# Patient Record
Sex: Female | Born: 1935 | Race: White | Hispanic: No | Marital: Married
Health system: Southern US, Community
[De-identification: ages and names within clinical notes are randomized; demographics above are authoritative.]

---

## 1999-03-21 ENCOUNTER — Other Ambulatory Visit: Admission: RE | Admit: 1999-03-21 | Discharge: 1999-03-21 | Payer: Self-pay | Admitting: Internal Medicine

## 1999-04-18 ENCOUNTER — Other Ambulatory Visit: Admission: RE | Admit: 1999-04-18 | Discharge: 1999-04-18 | Payer: Self-pay | Admitting: Gynecology

## 1999-04-29 ENCOUNTER — Emergency Department (HOSPITAL_COMMUNITY): Admission: EM | Admit: 1999-04-29 | Discharge: 1999-04-29 | Payer: Self-pay | Admitting: Emergency Medicine

## 2000-04-02 ENCOUNTER — Other Ambulatory Visit: Admission: RE | Admit: 2000-04-02 | Discharge: 2000-04-02 | Payer: Self-pay | Admitting: Internal Medicine

## 2000-07-01 ENCOUNTER — Encounter: Payer: Self-pay | Admitting: Internal Medicine

## 2000-07-01 ENCOUNTER — Encounter: Admission: RE | Admit: 2000-07-01 | Discharge: 2000-07-01 | Payer: Self-pay | Admitting: Internal Medicine

## 2000-09-30 ENCOUNTER — Other Ambulatory Visit: Admission: RE | Admit: 2000-09-30 | Discharge: 2000-09-30 | Payer: Self-pay | Admitting: Internal Medicine

## 2001-04-14 ENCOUNTER — Other Ambulatory Visit: Admission: RE | Admit: 2001-04-14 | Discharge: 2001-04-14 | Payer: Self-pay | Admitting: Internal Medicine

## 2003-05-24 ENCOUNTER — Other Ambulatory Visit: Admission: RE | Admit: 2003-05-24 | Discharge: 2003-05-24 | Payer: Self-pay | Admitting: Internal Medicine

## 2003-12-08 ENCOUNTER — Encounter: Admission: RE | Admit: 2003-12-08 | Discharge: 2003-12-08 | Payer: Self-pay | Admitting: Internal Medicine

## 2004-06-13 ENCOUNTER — Encounter: Admission: RE | Admit: 2004-06-13 | Discharge: 2004-06-13 | Payer: Self-pay | Admitting: Internal Medicine

## 2005-06-27 ENCOUNTER — Encounter: Admission: RE | Admit: 2005-06-27 | Discharge: 2005-06-27 | Payer: Self-pay | Admitting: Family Medicine

## 2005-07-04 ENCOUNTER — Encounter: Admission: RE | Admit: 2005-07-04 | Discharge: 2005-07-04 | Payer: Self-pay | Admitting: Family Medicine

## 2005-07-28 ENCOUNTER — Emergency Department (HOSPITAL_COMMUNITY): Admission: EM | Admit: 2005-07-28 | Discharge: 2005-07-28 | Payer: Self-pay | Admitting: Emergency Medicine

## 2005-08-03 ENCOUNTER — Encounter: Admission: RE | Admit: 2005-08-03 | Discharge: 2005-08-03 | Payer: Self-pay | Admitting: Family Medicine

## 2005-12-30 IMAGING — CR DG CHEST 2V
2 series · 2 of 2 positions shown · non-contrast
Comparison: No prior exams for comparison.

CLINICAL DATA: 77-year-old with confusion, inappropriate behavior.  Unsteady gait.  
 CHEST ? 2 VIEW:

[view not recorded (1 of 2)]
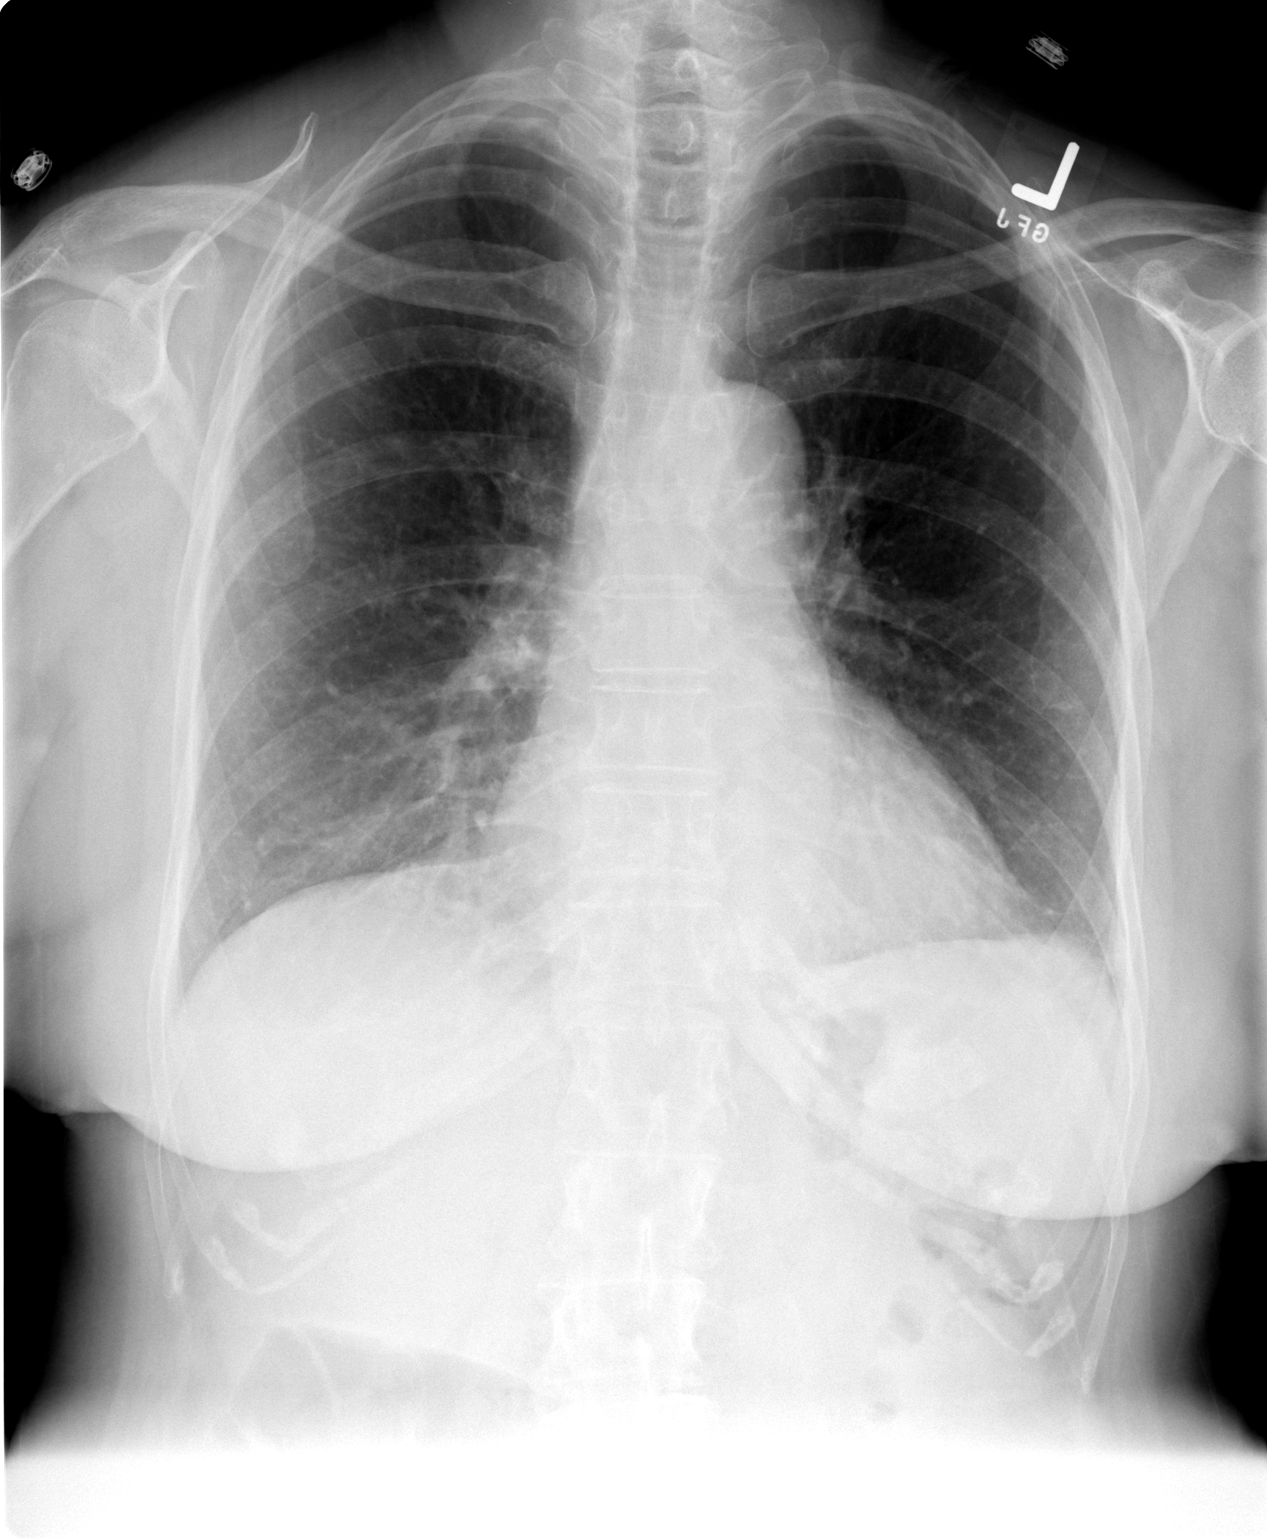

[view not recorded (2 of 2)]
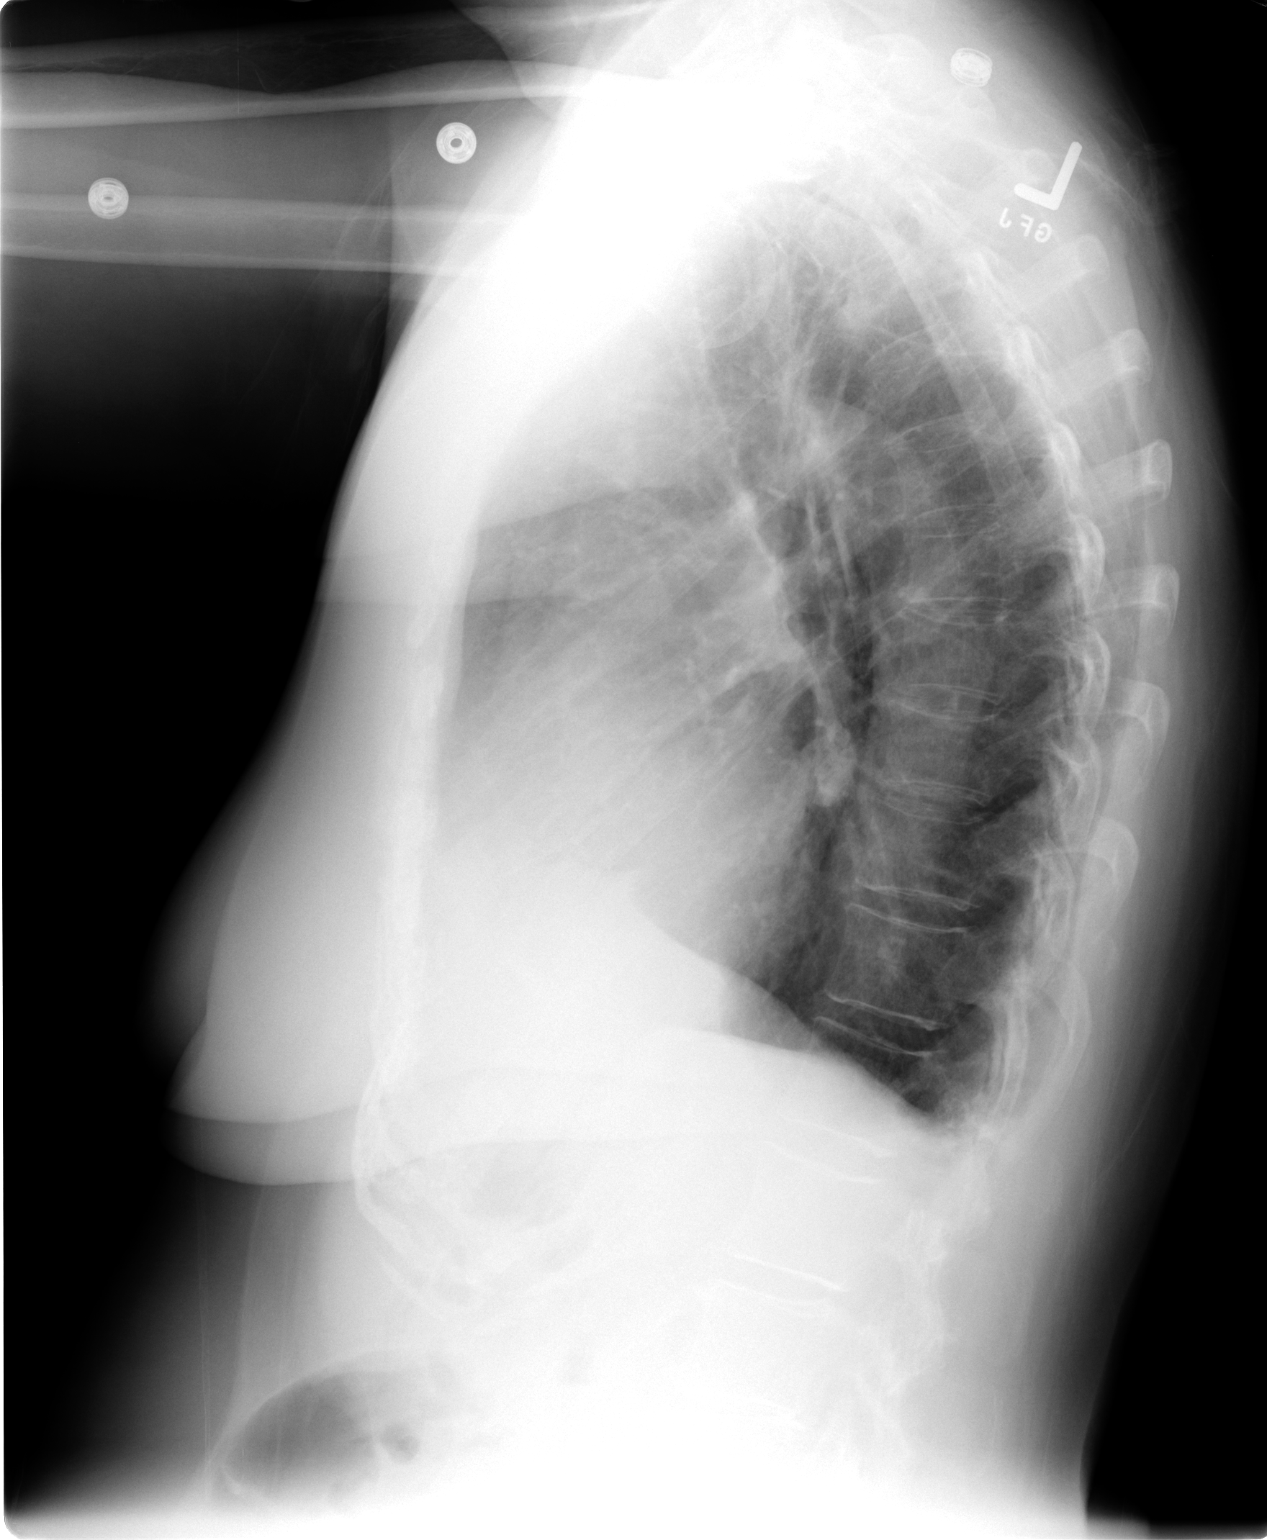

[2 of 2 positions shown; findings below may reference images not displayed]

The heart size and mediastinal contours are within normal limits.  Both lungs are clear.  The visualized skeletal structures are unremarkable.
IMPRESSION: No active cardiopulmonary disease.

## 2006-01-05 IMAGING — CR DG UGI W/ HIGH DENSITY W/KUB
19 of 24 series · 19 of 24 positions shown · non-contrast
Comparison: none

CLINICAL DATA: 77 year old with persistent hiccups, early satiety. 
DOUBLE CONTRAST UPPER GI W/KUB:
Initial barium swallows demonstrate esophageal dysmotility.  There were frequent tertiary contractions and probably some component of diffuse esophageal spasm with esophageal stasis.  No ulcerations.  There is also a widely patent lower esophageal mucosal ring and also a muscular ring.  13mm barium pill hung up just above the muscular ring but it eventually did pass through.  Patient has a moderate sized sliding type hiatal hernia and had several episodes of spontaneous gastroesophageal reflux.   Stomach, duodenal bulb, and C-loop are normal in appearance.

[run (1 of 19)]
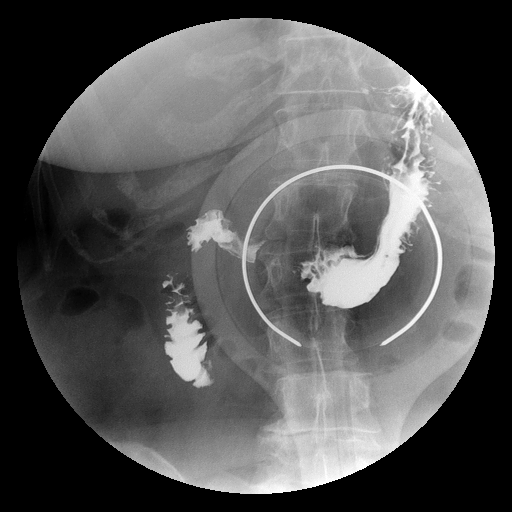

[run (2 of 19)]
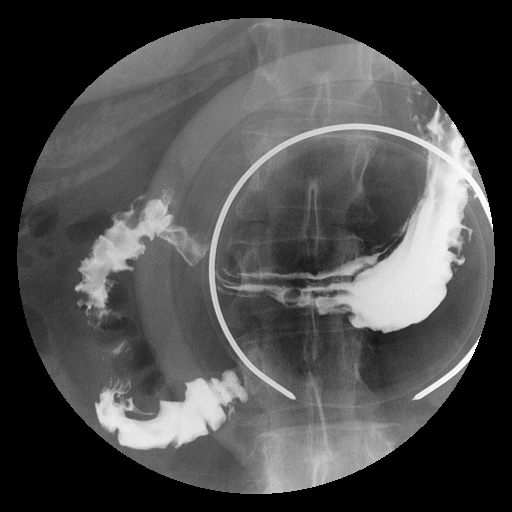

[run (3 of 19)]
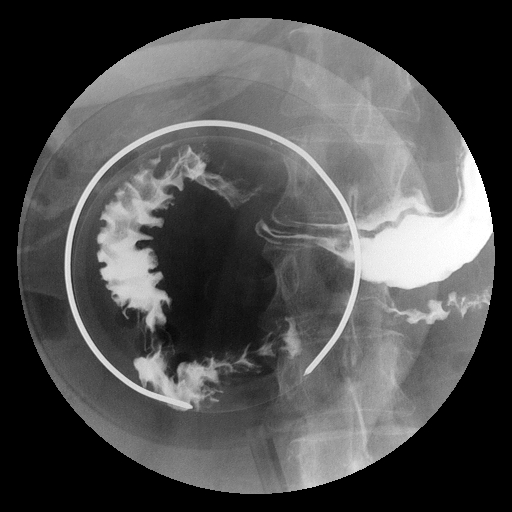

[run (4 of 19)]
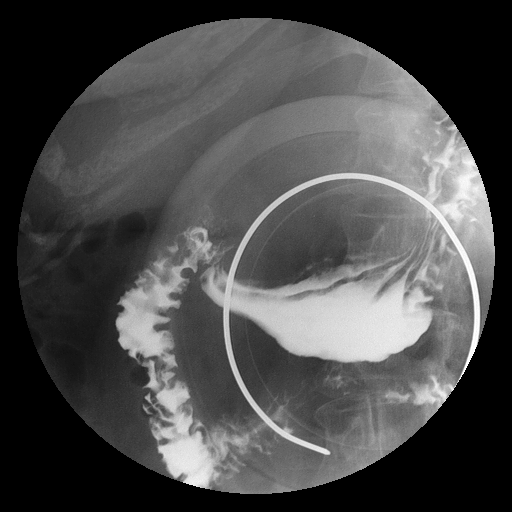

[run (5 of 19)]
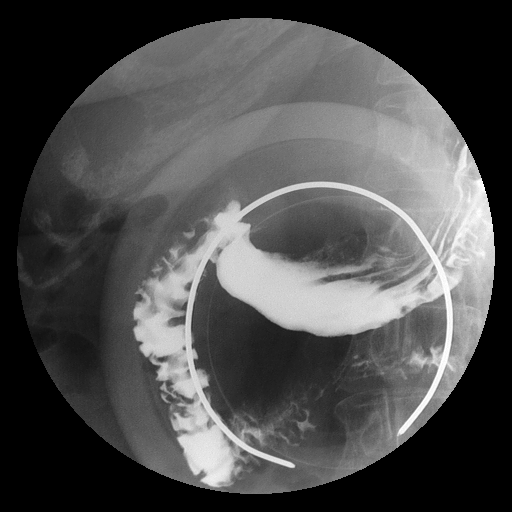

[run (6 of 19)]
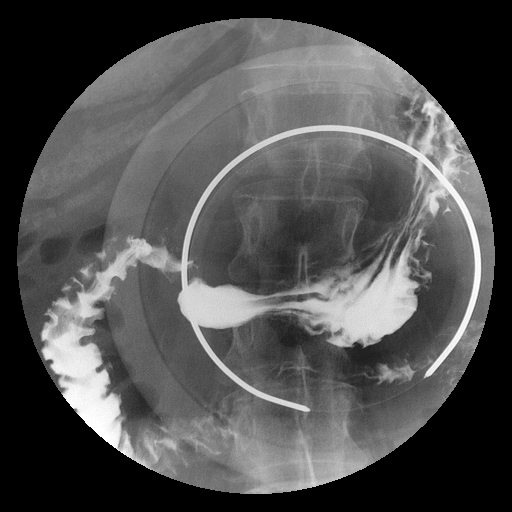

[run (7 of 19)]
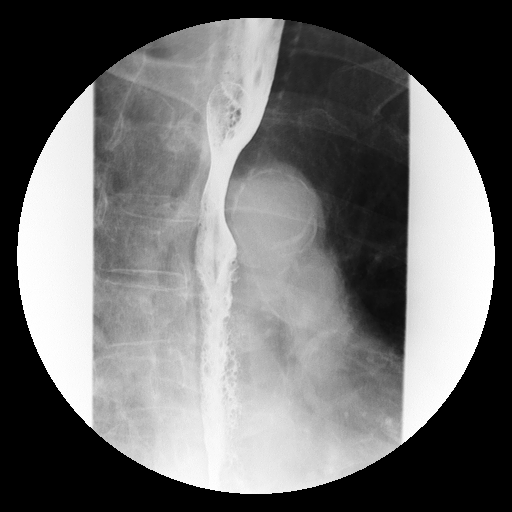

[run (8 of 19)]
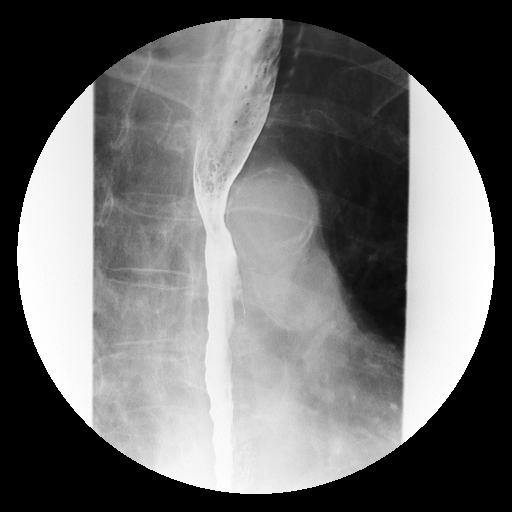

[run (9 of 19)]
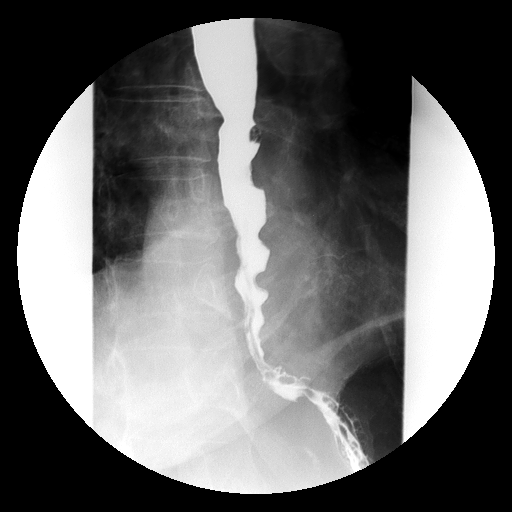

[run (10 of 19)]
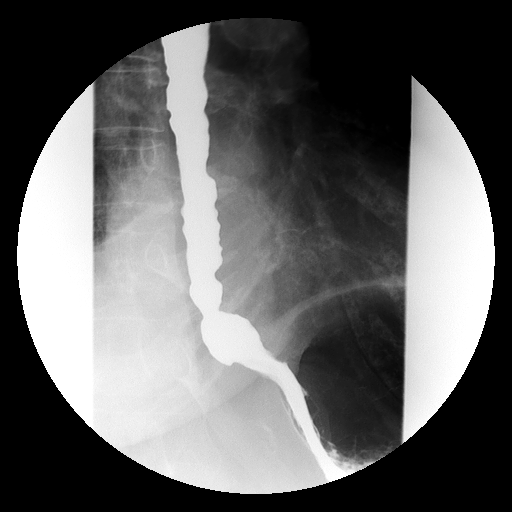

[run (11 of 19)]
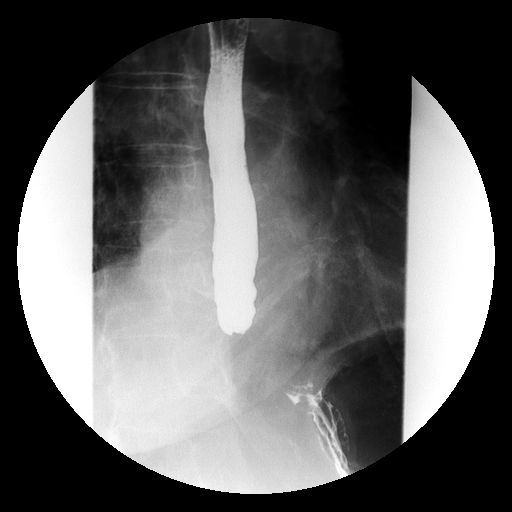

[run (12 of 19)]
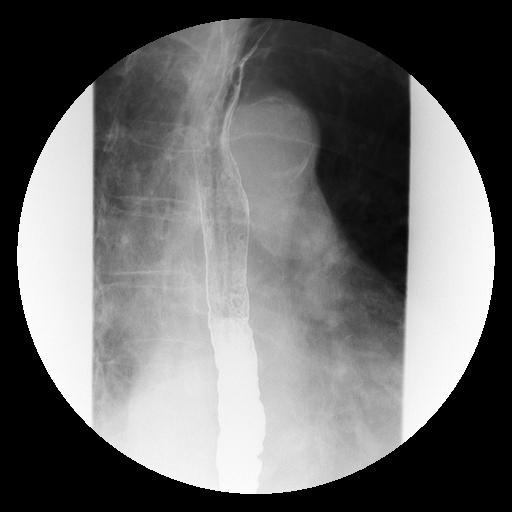

[run (13 of 19)]
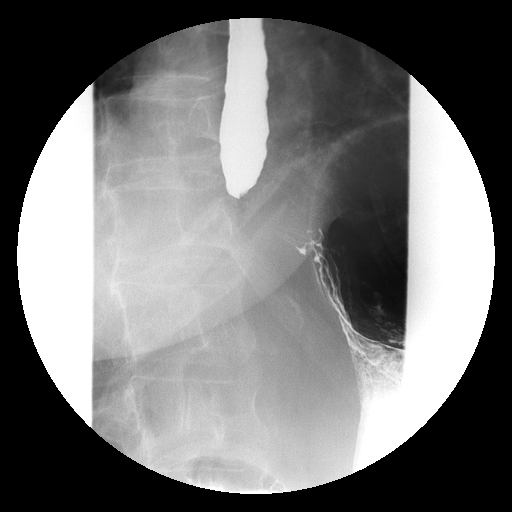

[run (14 of 19)]
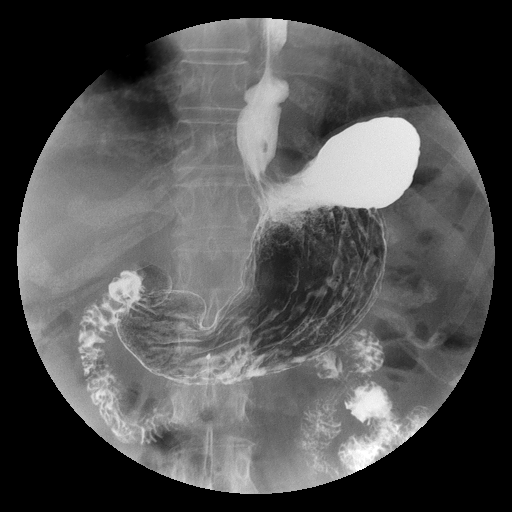

[run (15 of 19)]
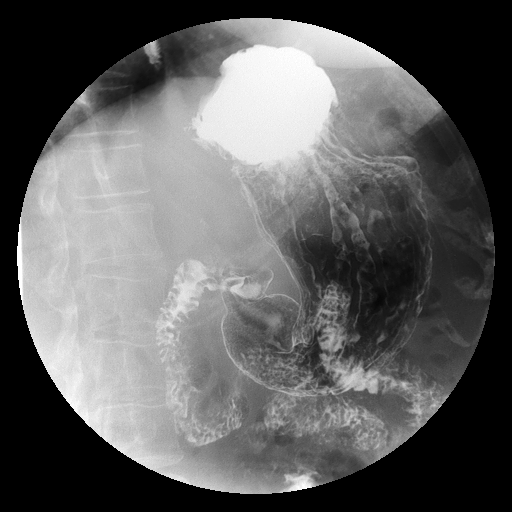

[run (16 of 19)]
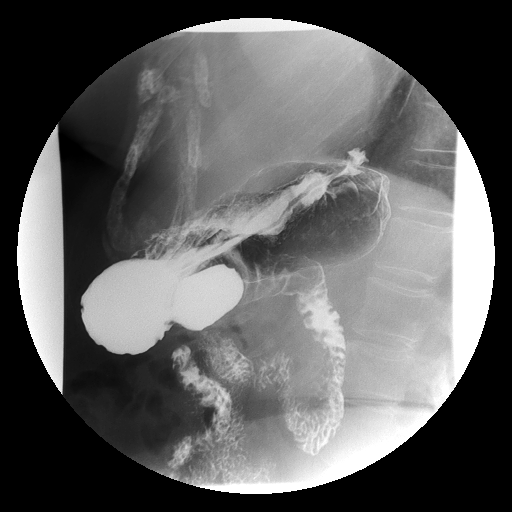

[run (17 of 19)]
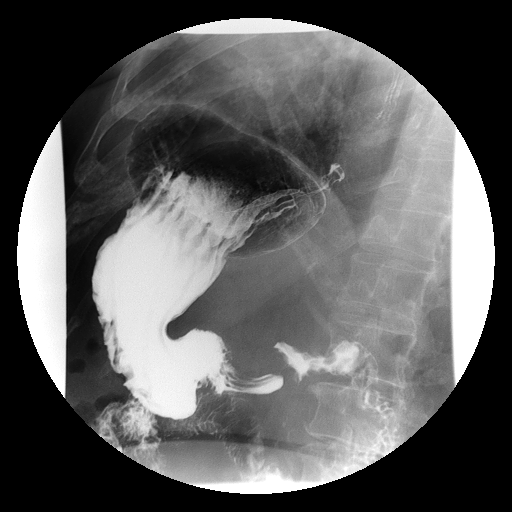

[run (18 of 19)]
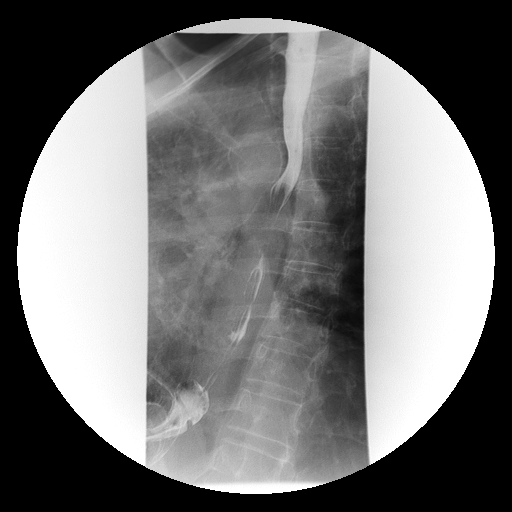

[run (19 of 19)]
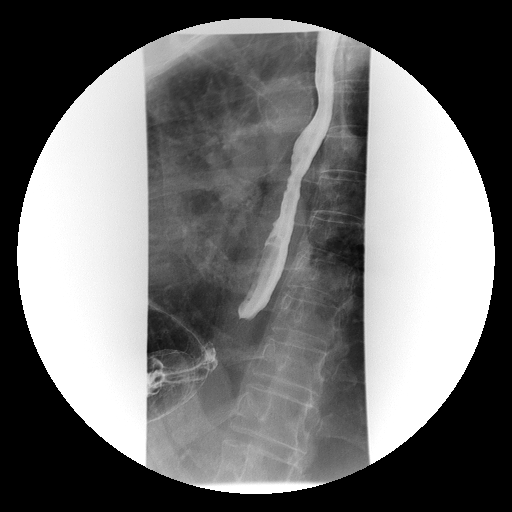

[19 of 24 positions shown; findings below may reference images not displayed]

IMPRESSION: 1.  Esophageal dysmotility with  some component of diffuse esophageal spasm and esophageal stasis.  
2.  Muscular and mucosal rings.  13mm barium pill did pass through both these areas. 
3.  Small sliding type hiatal hernia with spontaneous gastroesophageal reflux. 
4.  Normal appearance of the stomach.

## 2006-07-16 ENCOUNTER — Encounter: Admission: RE | Admit: 2006-07-16 | Discharge: 2006-07-16 | Payer: Self-pay | Admitting: Family Medicine

## 2006-07-26 ENCOUNTER — Encounter: Admission: RE | Admit: 2006-07-26 | Discharge: 2006-07-26 | Payer: Self-pay | Admitting: Neurology

## 2007-01-06 ENCOUNTER — Other Ambulatory Visit: Admission: RE | Admit: 2007-01-06 | Discharge: 2007-01-06 | Payer: Self-pay | Admitting: Family Medicine

## 2007-07-21 ENCOUNTER — Encounter: Admission: RE | Admit: 2007-07-21 | Discharge: 2007-07-21 | Payer: Self-pay | Admitting: Family Medicine

## 2007-07-26 ENCOUNTER — Emergency Department (HOSPITAL_COMMUNITY): Admission: EM | Admit: 2007-07-26 | Discharge: 2007-07-27 | Payer: Self-pay | Admitting: Emergency Medicine

## 2007-08-07 ENCOUNTER — Encounter: Admission: RE | Admit: 2007-08-07 | Discharge: 2007-08-07 | Payer: Self-pay | Admitting: Family Medicine

## 2007-08-19 ENCOUNTER — Inpatient Hospital Stay (HOSPITAL_COMMUNITY): Admission: EM | Admit: 2007-08-19 | Discharge: 2007-08-23 | Payer: Self-pay | Admitting: *Deleted

## 2007-09-26 ENCOUNTER — Inpatient Hospital Stay (HOSPITAL_COMMUNITY): Admission: EM | Admit: 2007-09-26 | Discharge: 2007-10-06 | Payer: Self-pay | Admitting: Emergency Medicine

## 2011-05-01 NOTE — Discharge Summary (Signed)
Courtney Hutchinson, MARSICO                 ACCOUNT NO.:  [id number]   MEDICAL RECORD NO.:  [id number]          PATIENT TYPE:  INP   LOCATION:  6740                         FACILITY:  [facility]   PHYSICIAN:  Michelene Gardener, MD    DATE OF BIRTH:  1928/03/20   DATE OF ADMISSION:  08/19/2007  DATE OF DISCHARGE:  08/23/2007                               DISCHARGE SUMMARY   PRIMARY CARE PHYSICIAN:  Lavonda Jumbo, M.D.   DISCHARGE DIAGNOSES:  1. Failure to thrive.  2. Anorexia.  3. Dehydration.  4. Leukocytosis.  5. Weakness and fatigability.  6. Hypokalemia.  7. Dementia.   DISCHARGE MEDICATIONS:  1. Vesicare 5 mg once daily.  2. Aricept 10 mg p.o. once daily.  3. Simvastatin 20 mg p.o. at bedtime.  4. Hydrochlorothiazide 25 mg p.o. once daily.  5. Aspirin 81 mg p.o. once daily.  6. Evista 60 mg p.o. once daily.  7. Prilosec 20 mg p.o. once daily.  8. Megace 400 mg/10 mL  to take 400 mg p.o. twice daily.   CONSULTATIONS:  None.   PROCEDURES:  None.   FOLLOW-UP APPOINTMENTS:  With primary physician in 1-2 weeks.   RADIOLOGY STUDIES:  None.   COURSE OF HOSPITALIZATION:  This is a 77 year old Caucasian female who  presented to the hospital on August 19, 2007 complaining of poor  appetite, weakness and fatigability.  The patient was admitted to the  hospital for hydration and further assessment.  The patient was seen by  dietician in the hospital and was recommended for appetite stimulant,  where she was started on Megace.  PT and OT evaluated the patient, and  the patient was recommended for home health with PT versus assisted  nursing facility.  The patient and her husband would like to go home  rather than an assisted facility.  A social worker saw the patient and  spoke to the family, and after discussion the patient was decided to go  to the family with home health PT and home health aide.   At the time of presentation the patient had 2 blood cultures drawn in  the ER; one of  them came to be normal, the other one came to be positive  for gram-positive cocci in clusters.  The final results are suggestive  of contamination.  No need for any antibiotics.    The patient will be continued on the same medications taken prior to  hospitalization, and no new prescriptions.  Home health PT in addition  to home health will be provided to the patient.  The patient is  currently stable.  No acute symptoms.  She will be discharged today and  followed by primary doctor in 1-2 weeks.   TOTAL ASSESSMENT TIME:  40 minutes.  Michelene Gardener, MD  Electronically Signed     NAE/MEDQ  D:  08/23/2007  T:  08/23/2007  Job:  [id number]   cc:   Lavonda Jumbo, M.D.
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
## 2011-05-01 NOTE — H&P (Signed)
NAME:  Courtney Hutchinson, Courtney Hutchinson                 ACCOUNT NO.:  [id number]   MEDICAL RECORD NO.:  [id number]          PATIENT TYPE:  EMS   LOCATION:  MAJO                         FACILITY:  [facility]   PHYSICIAN:  Barnetta Chapel, MDDATE OF BIRTH:  [birth year]   DATE OF ADMISSION:  08/19/2007  DATE OF DISCHARGE:                              HISTORY & PHYSICAL   CHIEF COMPLAINT:  1. Poor appetite.  2. Fatigue and weakness.   HISTORY OF PRESENT ILLNESS:  The patient is a 77 year old female with  past medical history significant for asthma, dementia, GERD,  hypertension, shingles, urinary tract infection.  The patient was  admitted to this hospital in early August and was managed for urinary  tract infection.  The patient developed shingles shortly after she got  home from the hospital.  She went to the primary care [facility] Trias and was  treated with some antiviral medication.  The shingles have improved  significantly, but the patient is experiencing postherpetic pain.  Since  the discharge from the hospital, the patient has deteriorated  progressively.  She has lost her appetite and is said to be very weak  and fatigued.  She is weak to the point that it is difficult for the  patient to get out of the bed.  She has not been drinking enough and the  patient looked quite dehydrated.  No associated fever or chills, no  productive cough, no headache or neck pain, no chest pain or shortness  of breath, and no GI symptoms.  No urinary symptoms.  The patient is  cared for by the husband and the daughter and the son-in-law.  The  husband just had a CVA a few days ago.  It is difficult for the family  to cope with the patient.   PAST MEDICAL HISTORY:  Essentially as above.   MEDICATIONS PRIOR TO ADMISSION:  1. Aricept 10 mg p.o. one daily.  2. HCTZ 25 mg p.o. one daily.  3. Zocor 20 mg b.i.d.  4. Evista 60 mg p.o. one daily.  5. VESIcare 500 p.o. one daily.  6. Prilosec 20 mg p.o. one daily.  7.  Aspirin.   ALLERGIES:  None.   SOCIAL HISTORY:  The patient is married with only one child.  The  daughter lives about five minutes away from the patient.  The main  caregiver is the husband, the daughter and the son-in-law.  It has  become very difficult for the caregivers to cope with the patient.  They  do not have any other input.  No history of cigarette smoking, alcohol  use or illicit substance abuse.   FAMILY HISTORY:  Noncontributory.   REVIEW OF SYSTEMS:  Essentially as above.   PHYSICAL EXAMINATION:  VITAL SIGNS:  Temp 98.5, blood pressure 124/78,  heart rate 110, respiratory rate 18.  GENERAL:  The patient is not in any distress.  HEENT:  No pallor, no jaundice.  Extraocular muscle movements intact.  Buccal mucosa is very dry.  NECK:  Supple.  No rigidity or lymphadenopathy.  LUNGS:  Clear to auscultation.  CVS:  S1, S2, tachycardia.  ABDOMEN:  Soft and benign.  Bowel sounds are present.  No organomegaly.  NEURO:  Nonfocal.  The patient moves all limbs.  EXTREMITIES:  No edema.   LABS:  Sodium 135, potassium 2.4, chloride 100, BUN 11, creatinine 0.8.  White count 11.8, hemoglobin 15, MCV 91.9, platelet count 285,000.  UA  is negative for nitrites, leukocyte esterase; ketones is 59 mg/dL.   IMPRESSION:  1. Failure to thrive.  2. Anorexia.  3. Dehydration.  4. Leukocytosis.  5. Weakness and fatigue.  6. Hypokalemia.  7. Dementia.   PLAN:  We will admit to patient to regular medical floor.  Will  adequately hydrate the patient.  We will replace potassium.  We will get  the dietician consult, social worker consult, case management consult  and PT/OT evaluation.  I will also get Speech to evaluate the patient's  swallowing.  The patient may eventually need to be placed.  Meanwhile,  we will use Megace 400 mg b.i.d. to stimulate the patient's appetite.  We will continue the patient's home meds.  We will pan culture the  patient.  We will repeat CBC plus dif in the  morning to check if the  leukocytosis would have resolved.  Meanwhile, we will also check  prealbumin and a thyroid function test.      Barnetta Chapel, MD  Electronically Signed     SIO/MEDQ  D:  08/19/2007  T:  08/19/2007  Job:  [id number]   cc:   Lavonda Jumbo, M.D.

## 2011-05-01 NOTE — Discharge Summary (Signed)
[name], [name]                 ACCOUNT NO.:  [id number]   MEDICAL RECORD NO.:  [id number]          PATIENT TYPE:  INP   LOCATION:  1416                         FACILITY:  [facility]   PHYSICIAN:  Ramiro Harvest, MD    DATE OF BIRTH:  08-12-1928   DATE OF ADMISSION:  09/26/2007  DATE OF DISCHARGE:  10/06/2007                               DISCHARGE SUMMARY   PRIMARY CARE PHYSICIAN:  Lavonda Jumbo, M.D.   DISCHARGE DIAGNOSES:  1. Community acquired pneumonia/aspiration pneumonia.  2. Hypoxemia.  3. Volume overload.  4. Dysphagia.  5. Failure to thrive with frequent falls.  6. Hypertension.  7. Hyperlipidemia.  8. Osteoarthritis.  9. Gastroesophageal reflux disease.   DISCHARGE MEDICATIONS:  1. Augmentin 875 mg p.o. b.i.d. x5 days.  2. Hydrochlorothiazide 25 mg one tablet p.o. daily.  3. Namenda 5 mg one tablet p.o. b.i.d.  4. Aricept 10 mg nightly.  5. Simvastatin 20 mg p.o. nightly.  6. Evista 60 mg p.o. q.a.m.  7. Vesicare 5 mg p.o. b.i.d.  8. Prevacid 30 mg p.o. q.a.m.  9. Aspirin 81 mg p.o. daily.  10.Prozac 20 mg p.o. nightly.   DISPOSITION AND FOLLOW-UP:  Patient will be discharged to a skilled  nursing facility.  A basic metabolic profile needs to be checked in the  morning to follow the patient's electrolytes.  A chest x-ray will also  need to be obtained in the next 8 weeks to reassess patient's left lower  lobe pneumonia.  Patient will need to be placed on a regular thin diet  with strict reflux precautions.  Patient's blood pressure will also need  to be reassessed and if further titration is needed, may add a calcium  channel blocker or ACE inhibitor for better control of patient's blood  pressure and will defer that to her primary care physician.   PROCEDURES PERFORMED:  1. Modified barium swallow was done on September 30, 2007.  2. A chest x-ray was obtained on September 26, 2007, that showed a low      volume chest with some right hilar findings, bibasilar  atelectasis.      The cervical spine showed mid cervical spondylitic change without      evidence of acute injury.  3. A head CT was obtained without contrast on September 26, 2007, that      showed a high left scalp hematoma, no underlying fracture or acute      intracranial abnormalities.  4. Chest x-ray obtained on September 28, 2007, showed worsening aeration      with increasing volume loss and opacity at the left base.  5. A swallow function was done on September 30, 2007.  6. Chest x-ray done on October 01, 2007, showed bibasilar atelectasis      with improved aeration of the left lung base intervally.  Small      bilateral pleural effusions.   CONSULTATION:  A consult was done with Dr. Jeanie Sewer of Psychiatry.   BRIEF HISTORY AND PHYSICAL:  Ms. Courtney Hutchinson is a 77 year old Caucasian  female with past medical history of multiple medical  problems who  presented with a complaint of a fall with a laceration to the head.  Patient lives with her husband in a two-story house.  A lot of help is  needed to do their daily activities.  Patient had been recently  hospitalized between August 19, 2007, and August 23, 2007, and was  discharged home.  There was a thought back then about sending the  patient to assisted living but her family chose to take the patient home  with home health.  The family had mentioned that they were not very  pleased with home health and only the daughter and the wife helped the  family with their daily activities.  Today the family went to visit and  the patient stood up and she tripped and fell to the floor and had a  laceration of her head.  Her husband tried to help and again he fell  down and hit his arm.  Patient came to the ER.  CT of the head was done,  which was normal.  Patient was put on oxygen due to low saturations.  On  room air, patient was sating in the 80s and that increased to 95 on two  liters nasal cannula.  Patient stated that she started  receiving  antibiotics from her PCP for possible bronchitis.   PHYSICAL EXAMINATION:  VITAL SIGNS:  Temperature 98.8, blood pressure  142/71, pulse 106, respiratory rate 20.  GENERAL APPEARANCE:  Elderly Caucasian female in no acute distress.  HEENT:  Normocephalic.  Patient has a scalp hematoma.  Conjunctivae are  pink.  Pupils are equal, round and reactive to light.  Oral mucous  membranes were dry.  No pharyngeal edema.  NECK:  Supple, no JVD, no carotid bruits.  No thyroid enlargement.  No  lymphadenopathy.  RESPIRATORY:  Lungs are clear to auscultation bilaterally, no wheezing,  rhonchi or rales.  CARDIOVASCULAR:  Regular rate and rhythm, no murmurs, rubs, or gallops.  ABDOMEN:  Soft, nontender, positive bowel sounds, nondistended, no  rebound, no guarding.  EXTREMITIES:  No clubbing, cyanosis, or edema.  SKIN:  No rash and no erythema.  NEUROLOGIC:  Patient was mildly confused which was her baseline.  Cranial nerves II-XII intact   LABORATORY DATA:  On admission, white count 13.8, hemoglobin 13.9,  hematocrit 40, platelet count 221, ANC 10.5.  Complete metabolic profile  with sodium 161, potassium 3, chloride 110, bicarb 24, BUN 14,  creatinine 0.97, glucose 130.  Bilirubin 0.9, alkaline phosphatase 50,  AST 26, ALT 25, total protein 6.2, albumin 3.1, calcium 8.5.  Point of  care markers with myoglobin 65.5, troponin I less than 0.05, CK-MB less  than 1.  UA was amber cloudy, specific gravity 1.034, pH 6, glucose  negative, bilirubin negative, trace ketones, blood negative, protein 30,  urobilinogen 1, nitrite negative, leukocytes moderate, wbc 3-6, bacteria  rare.   HOSPITAL COURSE:  PROBLEM #1 -  COMMUNITY ACQUIRED PNEUMONIA/ASPIRATION  PNEUMONIA:  Patient had initially come in, was desating on room air, was  put on oxygen.  Chest x-ray was done out of concern for bronchitis  initially.  Patient was placed on Avelox for that.  Patient had  developed a rash.  Chest x-ray  was obtained later which showed concern  for an infiltrate for community acquired pneumonia.  Patient's  antibiotics were subsequently changed to azithromycin and Rocephin.  Patient was placed on this.  Patient continued to improve  symptomatically.  It was felt that patient had some  form of dysphagia  and may have aspirated and as such, her antibiotics were modified.  Azithromycin was discontinued.  Patient was maintained on Rocephin and  clindamycin secondary to possible aspiration pneumonia.  Patient  continued to improve daily.  Blood cultures were obtained which were  negative.  Urine cultures were negative.  Respiratory therapy tried to  induce a sputum which was unsuccessful.  Patient continued to improve  symptomatically, continued to require less oxygen and clindamycin and  Rocephin were further discontinued and patient was started on p.o.  antibiotics of Augmentin.  Patient will need five more days of Augmentin  on discharge to the skilled nursing facility.  Patient will be  discharged in improved and stable condition on Augmentin 875 mg twice a  day x2 days.   PROBLEM #2 -  HYPOXEMIA:  Patient had initially presented with  desaturations and was felt this may be secondary to a bronchitis, was  initially treated with Avelox which was further switched to azithromycin  and Rocephin and then to clindamycin as there was a thought that the  patient may have aspirated.  Patient was put on oxygen.  Patient  continued to improve, have less O2 requirements and on the day of  discharge, patient's hypoxemia was resolved.  Patient was saturating  greater than 90% on room air.   PROBLEM #4 -  VOLUME OVERLOAD:  On September 28, 2007, it was thought that  patient was having a little bit of worsening shortness of breath.  It  was felt that patient was volume overloaded from her physical  examination.  Patient's IV fluids were saline locked.  Patient was  diuresed.  Patient improved  symptomatically.  During the rest of the  hospitalization, patient improved and had no more issues of volume  overload.   PROBLEM #5 -  DYSPHAGIA:  It was thought that patient had a dysphagia  when she had presented.  A swallow evaluation was done.  It was felt she  had a mild oropharyngeal dysphagia with aspiration.  Modified barium  swallow was done and it was felt that patient needed to be maintained on  regular thin diet with strict reflux precautions.  Patient was  maintained and tolerated this diet well.   PROBLEM #7 -  FAILURE TO THRIVE WITH FREQUENT FALLS:  This is what  patient initially presented with, a fall with laceration to the head.  Head CT was done.  It was negative.  PT, OT were consulted and came and  worked with the patient throughout the hospitalization and it was felt  that patient will need a skilled nursing facility for this.  Patient  worked with physical therapy throughout the hospitalization.  Placement  was found for patient due to falls and patient had no capacity for  decision making as determined by psychiatry.  Patient will be  transferred to a skilled nursing facility where she will receive PT and  OT.   PROBLEM #8 -  HYPERTENSION:  Stable throughout the hospitalization.  Patient was maintained on hydrochlorothiazide at an increased dose from  her home medication.  She will be maintained on hydrochlorothiazide 25  mg daily.  Blood pressure needs to be reassessed by the doctor at the  nursing home and, if needed, further blood pressure medications may be  added for further blood pressure control.   PROBLEM #9 -  HYPERLIPIDEMIA:  Stable throughout the hospitalization.  Patient was maintained on Zocor throughout the hospitalization.   PROBLEM #10 -  HYPOKALEMIA:  Patient initially presented with  hypokalemia.  Her potassium was replaced and was stable.  Basic  metabolic profile will need to be checked at the facility and patient's  electrolytes  followed.  Potassium may be repleted or patient may be  maintained on a daily dose of potassium and potassium monitored.  The  rest of patient's chronic medical issues were stable throughout the  hospitalization.  Patient was discharged in stable and improved  condition.   DISCHARGE VITAL SIGNS:  Patient had a temperature of 98.1, pulse 97,  blood pressure 150/80, respiratory rate 20, sating 92% on room air.   DISCHARGE LABORATORY DATA:  Sodium 136, potassium 3.4, chloride 103,  bicarb 26, BUN 15, creatinine 0.91, glucose 98, calcium 8.8.  Patient's  potassium was replaced just prior to discharge and a basic metabolic  profile needs to be checked in the morning.   It has been a pleasure taking care of Ms. Paulene Floor.      Ramiro Harvest, MD  Electronically Signed     DT/MEDQ  D:  10/06/2007  T:  10/06/2007  Job:  [id number]   cc:   Lavonda Jumbo, M.D.  Fax: [phone]   Antonietta Breach, M.D.

## 2011-05-01 NOTE — H&P (Signed)
NAME:  Courtney Hutchinson, Courtney Hutchinson                 ACCOUNT NO.:  [id number]   MEDICAL RECORD NO.:  [id number]          PATIENT TYPE:  INP   LOCATION:  1530                         FACILITY:  [facility]   PHYSICIAN:  Michelene Gardener, MD    DATE OF BIRTH:  1928/06/30   DATE OF ADMISSION:  09/26/2007  DATE OF DISCHARGE:                              HISTORY & PHYSICAL   PRIMARY CARE PHYSICIAN:  Lavonda Jumbo, M.D.   CHIEF COMPLAINT:  The patient fell down and had some laceration in her  head.   HISTORY OF PRESENT ILLNESS:  This is a 77 year old Caucasian female with  past medical history of multiple medical problems who presented with the  above-mentioned complaint.  The patient lives with her husband in a two-  story house. They need a lot of help to do their daily activities.  She  was recently hospitalized in the period between September 2 and  September 6 was discharged home at that time.  There was some thought  about sending the patient to assisted living, but the patient's family  chose to take the patient home with home health.  The family mentioned  that they are not very pleased with the home health.  Normally the  daughter and the wife helped the family with their daily activities.  Today the family went to visit them. She stood up, and then she tripped  and fell down to the floor and had laceration of her head. Her husband  tried to help, and again he fell down and hit his arm.  The patient came  to the ER. CT scan of the head was done and came back normal.  The  patient was put on oxygen because of low saturation. On room air, she  was saturating in the 80s, and that increased to around 95 on 2 liters  nasal cannula.  The patient was started yesterday on antibiotics by the  primary physician for possible bronchitis.   PAST MEDICAL HISTORY:  Significant for:  1. Asthma.  2. Dementia.  3. GERD.  4. Hypertension.  5. Shingles,  6. Hypertension.  7. Hyperlipidemia.  8. Failure to thrive.   PAST SURGICAL HISTORY:  Denied.   CURRENT MEDICATIONS:  1. Vesicare 5 mg p.o. once daily.  2. Aricept 10 mg p.o. once a day.  3. Simvastatin 20 mg p.o. at bedtime.  4. Hydrochlorothiazide 25 mg p.o. once daily.  5. Aspirin 81 mg p.o. once daily.  6. Evista 60 mg p.o. once daily.  7. Prilosec 20 mg p.o. once daily.  8. Megace 400 mg p.o. twice daily.   ALLERGIES:  No known drug allergies.   SOCIAL HISTORY:  This patient is married.  She has one daughter. Her  daughter lives around 5 minutes away from her home.  She lives with her  husband.  There is no history of tobacco abuse.  No alcohol abuse, and  there is no illicit drug usage. The patient lives with her husband in a  two-story family home.   FAMILY HISTORY:  Noncontributory.   REVIEW OF SYSTEMS:  As per HPI.   PHYSICAL EXAMINATION:  VITAL SIGNS:  On arrival, temperature is 98.8,  blood pressure 142/71, pulse 106, respiratory rate 20.  GENERAL APPEARANCE:  This is an elderly Caucasian female not in acute  distress.  HEENT:  Her conjunctiva is pink.  Pupils are equal and reactive to  light.  There is no ptosis.  Hearing is diminished.  No ear discharge or  infection.  No nasal infection or bleeding.  Oral mucous membranes are  dry.  No pharyngeal erythema.  NECK:  Supple.  No JVD or carotid bruit.  No thyroid enlargement.  No  thyroid tenderness.  CARDIOVASCULAR:  S1 and S2 regular.  There is no murmur, gallops, or  thrills. RESPIRATORY:  The patient is breathing between 16 to 18.  There  are no rales, rhonchi, no wheezes.  ABDOMEN:  The abdomen soft, nondistended tenderness.  Bowel sounds are  normal.  Umbilicus is central.  EXTREMITIES:  Lower extremities have no edema, no varicose veins.  SKIN:  No rash and no erythema.  NEUROLOGIC:  Cranial nerves II-XII intact.  The patient is mildly  confused which is her baseline.   LABORATORY RESULTS:  There are no labs drawn on her.   She had CT scan of the head which showed  no evidence of acute  abnormalities.   IMPRESSION AND ASSESSMENT:  1. Acute bronchitis.  Will put this patient on Avelox 400 mg once a      day.  Will put her on oxygen and try to taper down her oxygen as      tolerated.  Will put her in nebulizer treatment as needed.  2. Hypoxemia.  This is secondary to bronchitis.  As mentioned above,      will apply oxygen as needed.  3. Failure to strive.  I discussed with the family the possibility of      assisted living placement.  The family will think about this and      will get back to me. Meanwhile, we will get physical therapy      evaluation.  4. Hypertension.  Continue current medications.  5. Hyperlipidemia.  Continue current medications.  6. Osteoporosis.  7. Gastroesophageal reflux disease.   Time is 1 hour.      Michelene Gardener, MD  Electronically Signed     NAE/MEDQ  D:  09/26/2007  T:  09/27/2007  Job:  [id number]   cc:   Lavonda Jumbo, M.D.  Fax: [phone]

## 2011-09-26 LAB — CBC
HCT: 36.2
HCT: 36.2
HCT: 37.3
HCT: 38.7
Hemoglobin: 12.4
Hemoglobin: 12.6
Hemoglobin: 12.8
Hemoglobin: 13.2
MCHC: 34.2
MCHC: 34.4
MCHC: 34.4
MCHC: 34.5
MCHC: 34.9
MCV: 89.2
MCV: 90.5
Platelets: 314
Platelets: 321
Platelets: 348
RBC: 3.99
RBC: 4.06
RBC: 4.13
RDW: 12.7
RDW: 13.5
WBC: 11.6 — ABNORMAL HIGH

## 2011-09-26 LAB — BASIC METABOLIC PANEL
BUN: 15
BUN: 15
BUN: 17
CO2: 25
CO2: 27
CO2: 27
Calcium: 8.4
Calcium: 8.8
Calcium: 8.8
Calcium: 8.9
Chloride: 104
Chloride: 106
Chloride: 106
GFR calc Af Amer: >60
GFR calc Af Amer: >60
GFR calc Af Amer: >60
GFR calc Af Amer: >60
GFR calc non Af Amer: 52 — ABNORMAL LOW
GFR calc non Af Amer: 60 — ABNORMAL LOW
Glucose, Bld: 107 — ABNORMAL HIGH
Glucose, Bld: 109 — ABNORMAL HIGH
Glucose, Bld: 114 — ABNORMAL HIGH
Glucose, Bld: 98
Potassium: 3.3 — ABNORMAL LOW
Potassium: 3.5
Potassium: 3.6
Potassium: 3.8
Sodium: 136
Sodium: 137
Sodium: 139
Sodium: 140

## 2011-09-27 LAB — CBC
HCT: 38.7
HCT: 40
Hemoglobin: 13.4
Hemoglobin: 13.9
MCHC: 34.6
MCHC: 34.7
MCV: 90.1
MCV: 91
Platelets: 307
RBC: 4.3
RDW: 13.5
RDW: 13.7
WBC: 12 — ABNORMAL HIGH

## 2011-09-27 LAB — URINALYSIS, ROUTINE W REFLEX MICROSCOPIC
Bilirubin Urine: NEGATIVE
Nitrite: NEGATIVE
Urobilinogen, UA: 1

## 2011-09-27 LAB — BASIC METABOLIC PANEL
BUN: 12
BUN: 21
CO2: 24
CO2: 25
CO2: 26
Calcium: 8.2 — ABNORMAL LOW
Chloride: 104
Chloride: 110
Chloride: 110
Creatinine, Ser: 1
GFR calc Af Amer: >60
GFR calc non Af Amer: 54 — ABNORMAL LOW
Glucose, Bld: 112 — ABNORMAL HIGH
Glucose, Bld: 118 — ABNORMAL HIGH
Glucose, Bld: 123 — ABNORMAL HIGH
Potassium: 3 — ABNORMAL LOW
Potassium: 3.6
Potassium: 3.8
Sodium: 140
Sodium: 141

## 2011-09-27 LAB — DIFFERENTIAL
Basophils Relative: 1
Lymphs Abs: 1.8
Monocytes Relative: 9
Neutro Abs: 10.5 — ABNORMAL HIGH
Neutrophils Relative %: 77

## 2011-09-27 LAB — POCT CARDIAC MARKERS
CKMB, poc: <1 — ABNORMAL LOW
Myoglobin, poc: 65.5

## 2011-09-27 LAB — COMPREHENSIVE METABOLIC PANEL
Alkaline Phosphatase: 50
BUN: 14
Calcium: 8.5
Glucose, Bld: 130 — ABNORMAL HIGH
Total Protein: 6.2

## 2011-09-27 LAB — URINE CULTURE
Colony Count: 6000
Special Requests: NEGATIVE

## 2011-09-28 LAB — URINALYSIS, ROUTINE W REFLEX MICROSCOPIC
Bilirubin Urine: NEGATIVE
Glucose, UA: NEGATIVE
Hgb urine dipstick: NEGATIVE
Ketones, ur: 15 — AB
Nitrite: NEGATIVE
Specific Gravity, Urine: 1.018
pH: 7.5

## 2011-09-28 LAB — CULTURE, BLOOD (ROUTINE X 2): Culture: NO GROWTH

## 2011-09-28 LAB — I-STAT 8, (EC8 V) (CONVERTED LAB)
Acid-Base Excess: 9 — ABNORMAL HIGH
Acid-Base Excess: 9 — ABNORMAL HIGH
Bicarbonate: 31.2 — ABNORMAL HIGH
Bicarbonate: 33 — ABNORMAL HIGH
Glucose, Bld: 120 — ABNORMAL HIGH
Potassium: 3.4 — ABNORMAL LOW
Potassium: 4.4
Sodium: 135
TCO2: 32
TCO2: 34
pCO2, Ven: 32.8 — ABNORMAL LOW
pH, Ven: 7.5 — ABNORMAL HIGH
pH, Ven: 7.586 — ABNORMAL HIGH

## 2011-09-28 LAB — CBC
HCT: 38
MCHC: 34.7
MCV: 91.9
MCV: 92.1
Platelets: 252
RBC: 4.12
RDW: 12.9
WBC: 10.3

## 2011-09-28 LAB — DIFFERENTIAL
Basophils Absolute: 0.1
Basophils Relative: 1
Eosinophils Absolute: 0.2
Eosinophils Absolute: 0.2
Lymphocytes Relative: 21
Lymphs Abs: 2.2
Monocytes Absolute: 1.2 — ABNORMAL HIGH
Monocytes Relative: 9
Neutro Abs: 9 — ABNORMAL HIGH
Neutrophils Relative %: 67
Neutrophils Relative %: 76

## 2011-09-28 LAB — COMPREHENSIVE METABOLIC PANEL
ALT: 10
AST: 16
Albumin: 2.9 — ABNORMAL LOW
Alkaline Phosphatase: 57
BUN: 8
CO2: 27
Calcium: 8.3 — ABNORMAL LOW
Chloride: 102
Creatinine, Ser: 0.86
GFR calc Af Amer: >60
GFR calc non Af Amer: >60
Glucose, Bld: 118 — ABNORMAL HIGH
Potassium: 3.9
Sodium: 139
Total Bilirubin: 1
Total Protein: 6.3

## 2011-09-28 LAB — TROPONIN I: Troponin I: 0.05

## 2011-09-28 LAB — CK TOTAL AND CKMB (NOT AT ARMC)
CK, MB: 0.7
Relative Index: INVALID
Total CK: 78

## 2011-09-28 LAB — URINE CULTURE
Colony Count: NO GROWTH
Culture: NO GROWTH

## 2011-09-28 LAB — BASIC METABOLIC PANEL
BUN: 9
Chloride: 106
GFR calc Af Amer: >60
GFR calc non Af Amer: >60
Potassium: 3.3 — ABNORMAL LOW

## 2011-09-28 LAB — PREALBUMIN: Prealbumin: 12.8 — ABNORMAL LOW

## 2011-09-28 LAB — MAGNESIUM: Magnesium: 2.2

## 2011-09-28 LAB — POCT I-STAT CREATININE
Creatinine, Ser: 0.8
Operator id: 146091

## 2011-09-28 LAB — POTASSIUM: Potassium: 3.6

## 2011-10-01 LAB — HEPATIC FUNCTION PANEL
Albumin: 3.9
Bilirubin, Direct: 0.2
Total Bilirubin: 1.5 — ABNORMAL HIGH

## 2011-10-01 LAB — URINALYSIS, ROUTINE W REFLEX MICROSCOPIC
Glucose, UA: NEGATIVE
Nitrite: POSITIVE — AB
Specific Gravity, Urine: 1.028
pH: 5.5

## 2011-10-01 LAB — I-STAT 8, (EC8 V) (CONVERTED LAB)
Acid-Base Excess: 1
BUN: 12
Chloride: 104
Potassium: 3.7
pH, Ven: 7.46 — ABNORMAL HIGH

## 2011-10-01 LAB — LIPASE, BLOOD: Lipase: 19

## 2011-10-01 LAB — CBC
HCT: 46.6 — ABNORMAL HIGH
Hemoglobin: 15.9 — ABNORMAL HIGH
RBC: 5.07
RDW: 12.3
WBC: 8.5

## 2011-10-01 LAB — URINE MICROSCOPIC-ADD ON

## 2011-10-01 LAB — POCT I-STAT CREATININE
Creatinine, Ser: 0.9
Operator id: 151321

## 2011-10-01 LAB — URINE CULTURE

## 2011-10-01 LAB — DIFFERENTIAL
Basophils Absolute: 0.1
Lymphocytes Relative: 14
Lymphs Abs: 1.2
Monocytes Absolute: 0.5
Monocytes Relative: 6
Neutro Abs: 6.7

## 2013-12-17 DEATH — deceased

## 2014-01-04 ENCOUNTER — Telehealth: Payer: Self-pay

## 2014-01-04 NOTE — Telephone Encounter (Signed)
[facility] [address] [facility] [facility] per Ileene Hutchinsonbituary in [employer]
